# Patient Record
Sex: Male | Born: 2000 | Hispanic: Yes | Marital: Single | Smoking: Never smoker
Health system: Southern US, Community
[De-identification: ages and names within clinical notes are randomized; demographics above are authoritative.]

## PROBLEM LIST (undated history)

## (undated) DIAGNOSIS — I10 Essential (primary) hypertension: Secondary | ICD-10-CM

## (undated) HISTORY — DX: Essential (primary) hypertension: I10

---
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
## 2023-06-11 IMAGING — CR DG CHEST 2V
2 series · 2 of 2 positions shown · non-contrast
Comparison: None.

CLINICAL DATA: Chest pain.

EXAM:
CHEST - 2 VIEW

[chest lat]
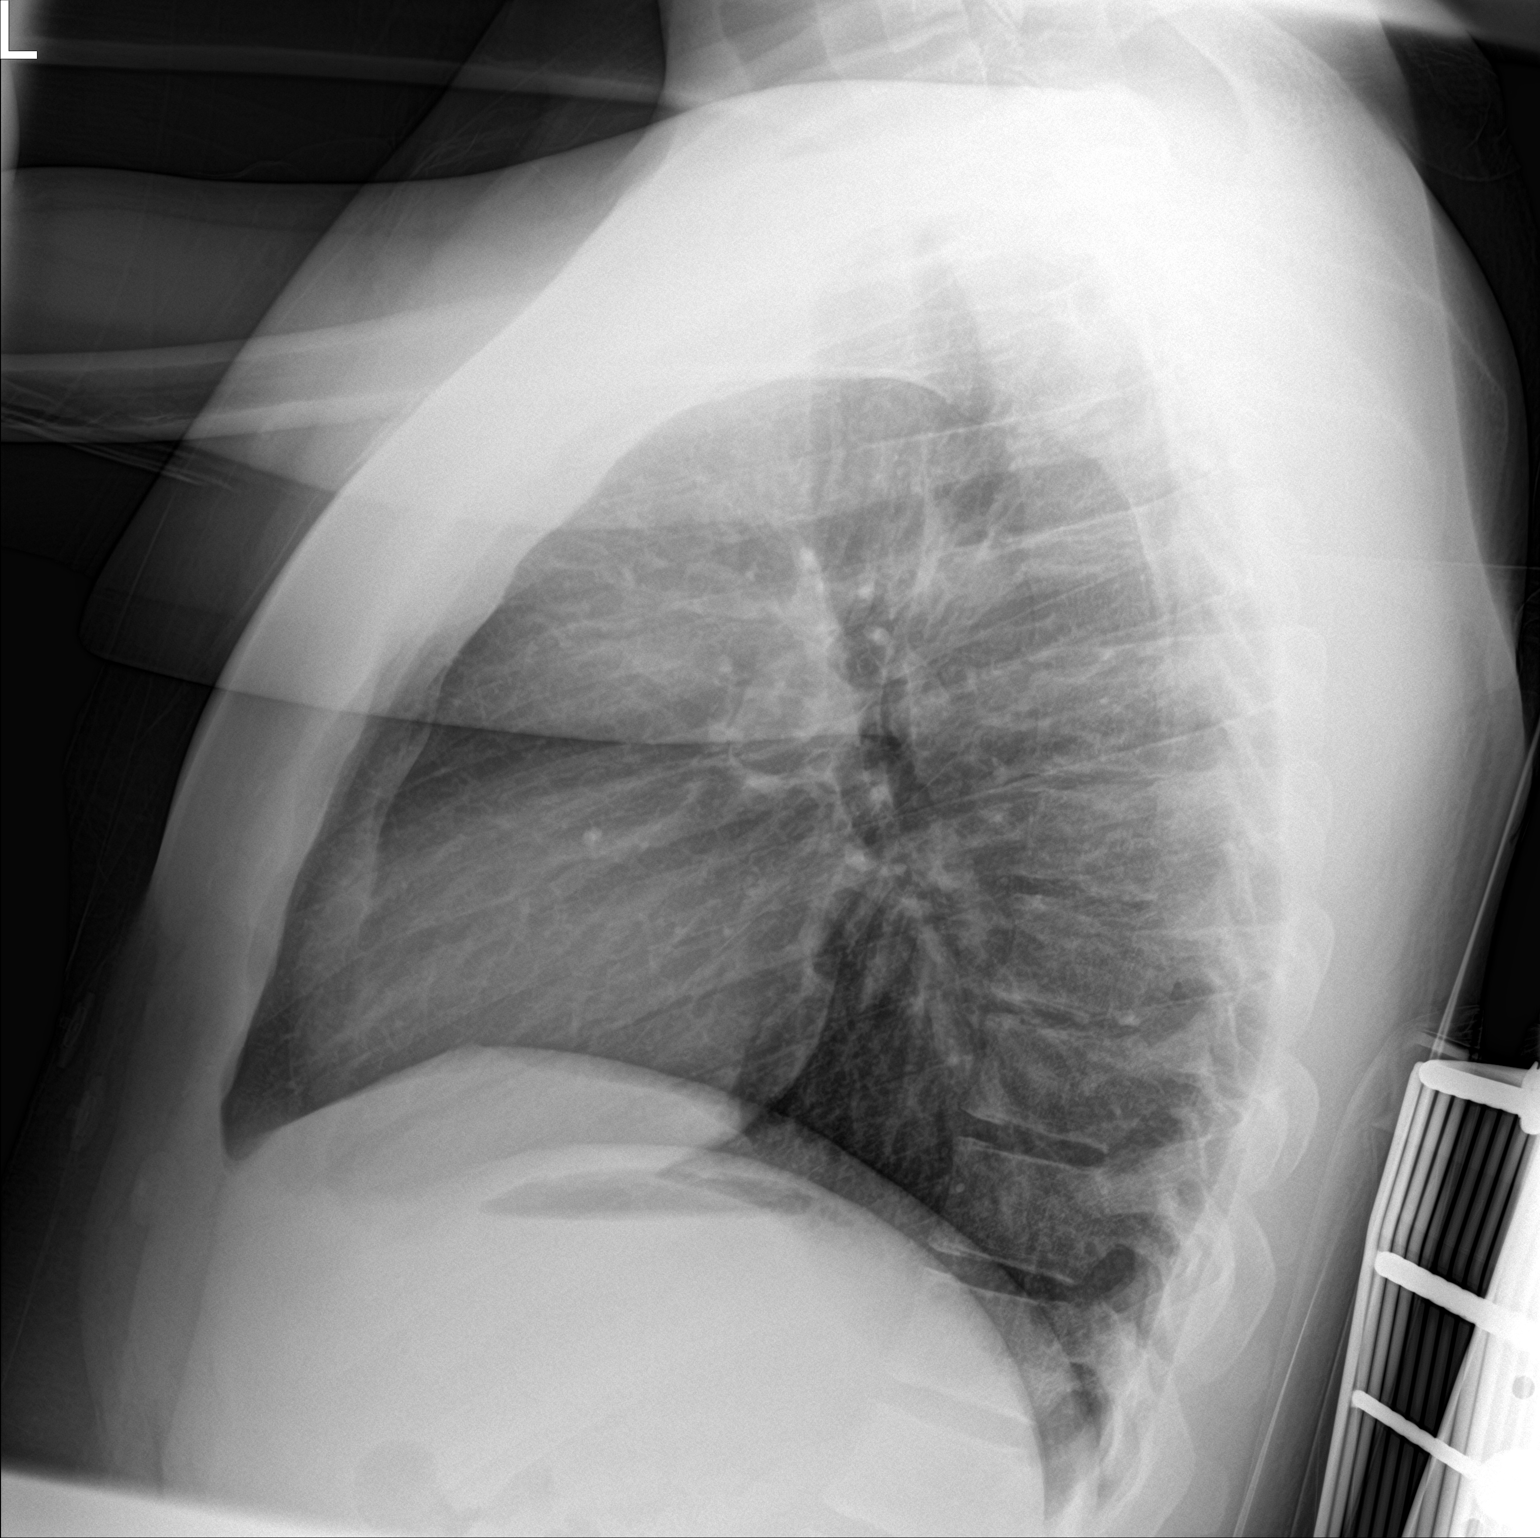

[chest ap]
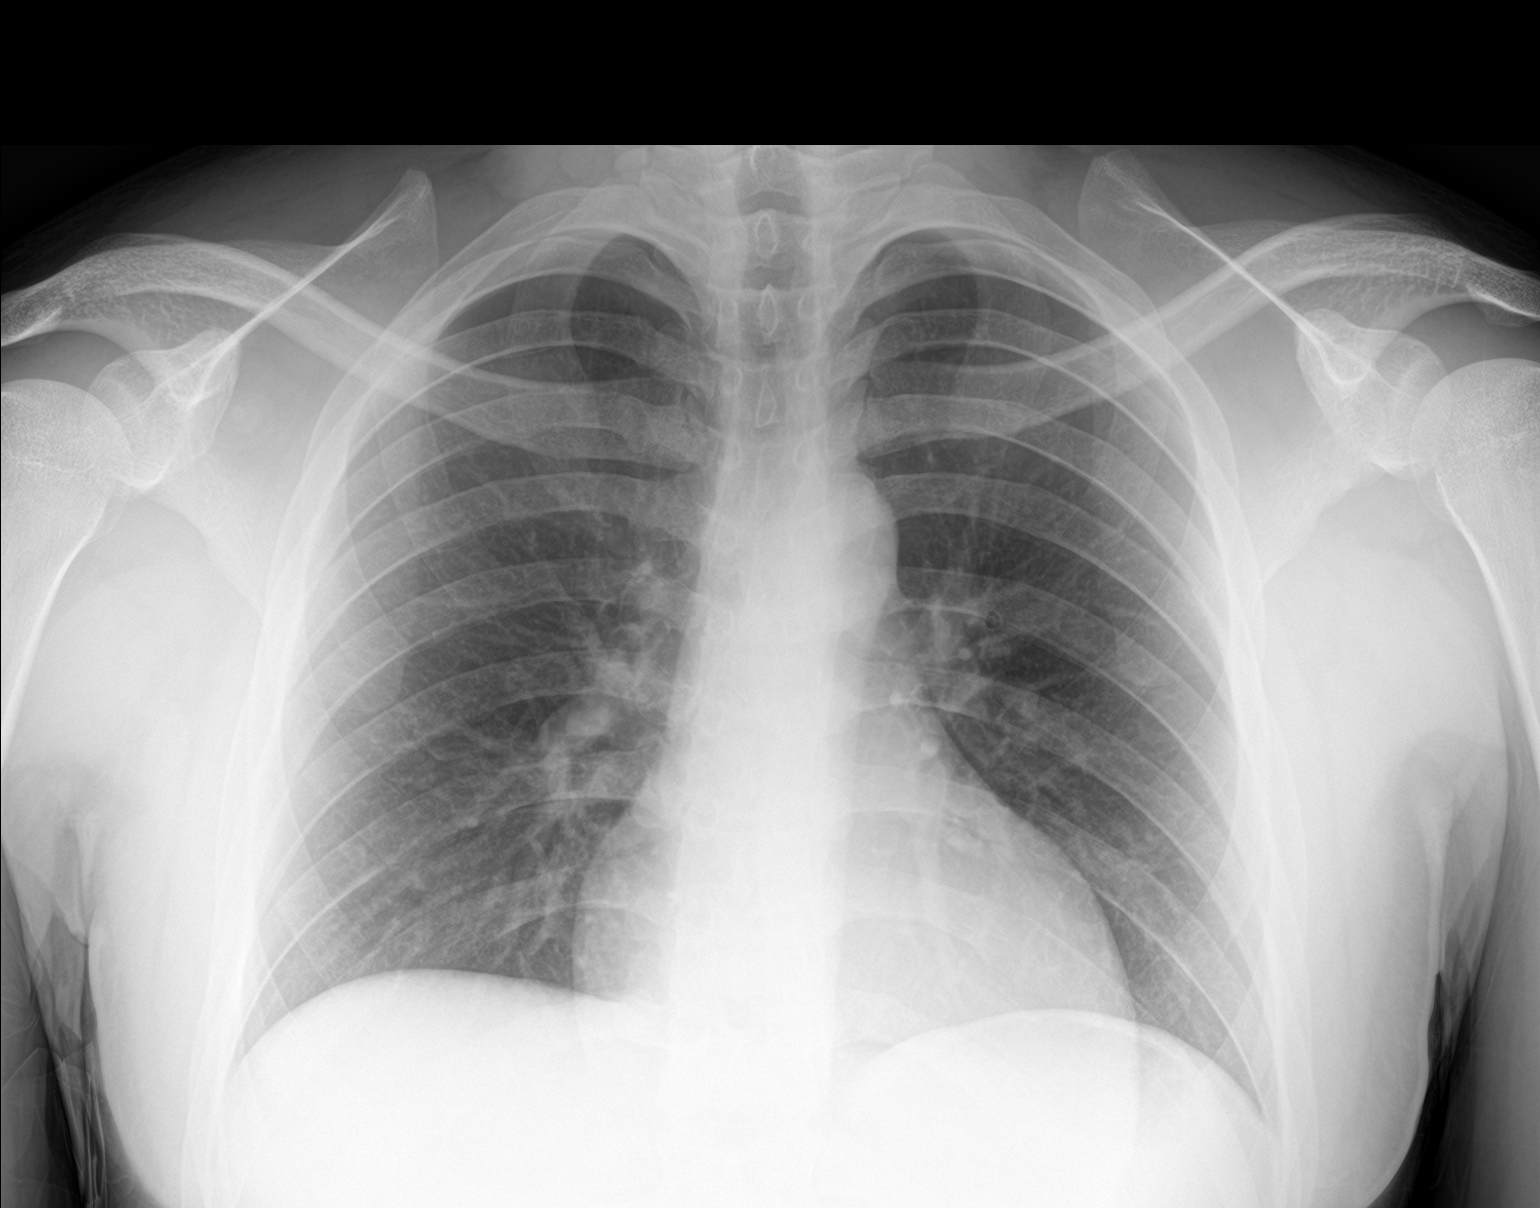

[2 of 2 positions shown; findings below may reference images not displayed]

FINDINGS: The cardiac silhouette is borderline in size. The hila, mediastinum,
lungs, and pleura are normal.
IMPRESSION: The cardiac silhouette is borderline in size. No other
abnormalities.
